# Patient Record
Sex: Male | Born: 1990 | Race: Black or African American | Hispanic: No | Marital: Single | Smoking: Former smoker
Health system: Southern US, Community
[De-identification: ages and names within clinical notes are randomized; demographics above are authoritative.]

## PROBLEM LIST (undated history)

## (undated) DIAGNOSIS — A6000 Herpesviral infection of urogenital system, unspecified: Secondary | ICD-10-CM

## (undated) HISTORY — DX: Herpesviral infection of urogenital system, unspecified: A60.00

---

## 2013-10-02 ENCOUNTER — Ambulatory Visit (INDEPENDENT_AMBULATORY_CARE_PROVIDER_SITE_OTHER): Payer: Managed Care, Other (non HMO) | Admitting: Family Medicine

## 2013-10-02 VITALS — BP 102/62 | HR 72 | Temp 98.7°F | Resp 16 | Ht 67.0 in | Wt 146.0 lb

## 2013-10-02 DIAGNOSIS — Z7251 High risk heterosexual behavior: Secondary | ICD-10-CM

## 2013-10-02 DIAGNOSIS — Z0000 Encounter for general adult medical examination without abnormal findings: Secondary | ICD-10-CM

## 2013-10-02 NOTE — Progress Notes (Addendum)
Subjective:   This chart was scribed for Dustin Staggers, MD by Arlan Organ, [facility] Scribe. This patient was seen in room 1 and the patient's care was started 2:36 PM.    Patient ID: Dustin Bolton, male    DOB: 1990/11/30, 23 y.o.   MRN: [id number]  HPI  HPI Comments: Dustin Bolton is a 23 y.o. 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 Pt states he is healthy without any medical issues. Currently he is not on any medications. States he pulled his back last year which causes him mild intermittent back pain. No history of surgeries. 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 Pt unaware of last Tetanus shot. Currently he is sexually active and has had about 15 partners. Denies any STI testing in the past. Denies any genital abnormalities. No swelling to groin. No history of TB.   There are no active problems to display for this patient.  History reviewed. No pertinent past medical history. History reviewed. No pertinent past surgical history. No Known Allergies Prior to Admission medications   Not on File   History   Social History  . Marital Status: Single    Spouse Name: N/A    Number of Children: N/A  . Years of Education: N/A   Occupational History  . Not on file.   Social History Main Topics  . Smoking status: Former Smoker    Quit date: 10/03/2011  . Smokeless tobacco: Not on file  . Alcohol Use: No  . Drug Use: No  . Sexual Activity: Not on file   Other Topics Concern  . Not on file   Social History Narrative  . No narrative on file     Review of Systems  Musculoskeletal: Positive for back pain (intermittent).  All other systems reviewed and are negative.  13 point ROS on pt health form. Positive for back pain only.  Objective:   Physical  Exam  Nursing note and vitals reviewed. Constitutional: He is oriented to person, place, and time. He appears well-developed and well-nourished.  HENT:  Head: Normocephalic and atraumatic.  Right Ear: External ear normal.  Left Ear: External ear normal.  Mouth/Throat: Oropharynx is clear and moist.  Eyes: Conjunctivae and EOM are normal. Pupils are equal, round, and reactive to light.  Neck: Normal range of motion. Neck supple. No thyromegaly present.  Cardiovascular: Normal rate, regular rhythm, normal heart sounds and intact distal pulses.   Pulmonary/Chest: Effort normal and breath sounds normal. No respiratory distress. He has no wheezes.  Abdominal: Soft. He exhibits no distension. There is no tenderness.  Musculoskeletal: Normal range of motion. He exhibits no edema and no tenderness.  Lymphadenopathy:    He has no cervical adenopathy.  Neurological: He is alert and oriented to person, place, and time. He has normal reflexes.  Skin: Skin is warm and dry.  Psychiatric: He has a normal mood and affect. His behavior is normal.       Filed Vitals:   10/02/13 1408  BP: 102/62  Pulse: 72  Temp: 98.7 F (37.1 C)  TempSrc: Oral  Resp: 16  Height: 5\' 7"  (1.702 m)  Weight: 146 lb (66.225 kg)  SpO2: 100%     Visual Acuity Screening   Right eye Left eye Both eyes  Without correction:     With correction: 20/15-2 20/15 20/13    Assessment & Plan:  .[name] is a 23 y.o. male Routine general medical examination at a health care facility - Plan: HIV antibody, RPR, GC/Chlamydia Probe Amp  Problems related to high-risk sexual behavior - Plan: HIV antibody, RPR, GC/Chlamydia Probe Amp  Age appropriate physical, with anticipatory guidance provided.  Discussed Menveo, Gardasil, and to check into last tdap.   STI testing as above. declined Hepatitis testing (thinks had Hep B vaccine series), and HSV testing.   No orders of the defined types were placed in this encounter.     Patient Instructions  You should receive a call or letter about your lab results within the next week to 10 days.   Check into your last tetanus - if more than 10 years - need repeat dose. Also - see information below on meningitis vaccine and HPV vaccine - we can perform those here if you would like.   Return to the clinic or go to the nearest emergency room if any of your symptoms worsen or new symptoms occur.  Keeping you healthy  Get these tests  Blood pressure- Have your blood pressure checked once a year by your healthcare provider.  Normal blood pressure is 120/80.  Weight- Have your body mass index (BMI) calculated to screen for obesity.  BMI is a measure of body fat based on height and weight. You can also calculate your own BMI at [email or web address].west-esparza.com/.  Cholesterol- Have your cholesterol checked regularly starting at age 61, sooner may be necessary if you have diabetes, high blood pressure, if a family member developed heart diseases at an early age or if you smoke.   Chlamydia, HIV, and other sexual transmitted disease- Get screened each year until the age of 3 then within three months of each new sexual partner.  Diabetes- Have your blood sugar checked regularly if you have high blood pressure, high cholesterol, a family history of diabetes or if you are overweight.  Get these vaccines  Flu shot- Every fall.  Tetanus shot- Every 10 years.  Menactra- Single dose; prevents meningitis.  Take these steps  Don't smoke- If you do smoke, ask your healthcare provider about quitting. For tips on how to quit, go to www.smokefree.gov or call 1-800-QUIT-NOW.  Be physically active- Exercise 5 days a week for at least 30 minutes.  If you are not already physically active start slow and gradually work up to 30 minutes of moderate physical activity.  Examples of moderate activity include walking briskly, mowing the yard, dancing, swimming bicycling, etc.  Eat a healthy diet-  Eat a variety of healthy foods such as fruits, vegetables, low fat milk, low fat cheese, yogurt, lean meats, poultry, fish, beans, tofu, etc.  For more information on healthy eating, go to www.thenutritionsource.org  Drink alcohol in moderation- Limit alcohol intake two drinks or less a day.  Never drink and drive.  Dentist- Brush and floss teeth twice daily; visit your dentis twice a year.  Depression-Your emotional health is as important as your physical health.  If you're feeling down, losing interest in things you normally enjoy please talk with your healthcare provider.  Gun Safety- If you keep a gun in your home, keep it unloaded and with the safety lock on.  Bullets should be stored separately.  Helmet use- Always wear a helmet when riding a motorcycle, bicycle, rollerblading or skateboarding.  Safe sex- If you may be exposed to a sexually  transmitted infection, use a condom  Seat belts- Seat bels can save your life; always wear one.  Smoke/Carbon Monoxide detectors- These detectors need to be installed on the appropriate level of your home.  Replace batteries at least once a year.  Skin Cancer- When out in the sun, cover up and use sunscreen SPF 15 or higher.  Violence- If anyone is threatening or hurting you, please tell your healthcare provider.   Meningococcal Vaccine What You Need to Know WHAT IS MENINGOCOCCAL DISEASE?  Meningococcal disease is a serious bacterial illness. It is a leading cause of bacterial meningitis in children 2 through 29 years old in the [place]. Meningitis is an infection of the covering of the brain and the spinal cord.  Meningococcal disease also causes blood infections.  About 1,000 to 1,200 people get meningococcal disease each year in the [place] Even when they are treated with antibiotics, 10% to 15% of these people die. Of those who live, another 11% to 19% lose their arms or legs, have problems with their nervous systems, become deaf or  mentally retarded, or suffer seizures or strokes.  Anyone can get meningococcal disease. But it is most common in infants less than 1 year of age and people 32 to 21 years. Children with certain medical conditions, such as lack of a spleen, have an increased risk of getting meningococcal disease. College freshmen living in dorms are also at increased risk.  Meningococcal infections can be treated with drugs such as penicillin. Still, many people who get the disease die from it, and many others are affected for life. This is why preventing the disease through use of meningococcal vaccine is important for people at highest risk. MENINGOCOCCAL VACCINE There are 2 kinds of meningococcal vaccines in the [place]  Meningococcal conjugate vaccine (MCV4) is the preferred vaccine for people 74 years of age and younger.  Meningococcal polysaccharide vaccine (MPSV4) has been available since the 1970s. It is the only meningococcal vaccine licensed for people older than 55. Both vaccines can prevent 4 types of meningococcal disease, including 2 of the 3 types most common in the [place] and a type that causes epidemics in [place]. There are other types of meningococcal disease; the vaccines do not protect against these.  WHO SHOULD GET MENINGOCOCCAL VACCINE AND WHEN? Routine Vaccination  Two doses of MCV4 are recommended for adolescents 11 through 24 years of age: the first dose at 72 or 24 years of age, with a booster dose at age 65.  Adolescents in this age group with HIV infection should get 3 doses: 2 doses 2 months apart at 37 or 12 years, plus a booster at age 6.  If the first dose (or series) is given between 82 and 66 years of age, the booster should be given between 62 and 15. If the first dose (or series) is given after the 16th birthday, a booster is not needed. Other People at Increased Risk  College freshmen living in dormitories.  Laboratory personnel who are routinely exposed to  meningococcal bacteria.  [employer] recruits.  Anyone traveling to, or living in, a part of the world where meningococcal disease is common, such as parts of [place].  Anyone who has a damaged spleen or whose spleen has been removed.  Anyone who has persistent complement component deficiency (an immune system disorder).  People who might have been exposed to meningitis during an outbreak. Children between 26 and 90 months of age, and anyone else with certain medical conditions  need 2 doses for adequate protection. Ask your doctor about the number and timing of doses, and the need for booster doses. MCV4 is the preferred vaccine for people in these groups who are 9 months through 24 years of age. MPSV4 can be used for adults older than 55. SOME PEOPLE SHOULD NOT GET MENINGOCOCCAL VACCINE OR SHOULD WAIT  Anyone who has ever had a severe (life-threatening) allergic reaction to a previous dose of MCV4 or MPSV4 vaccine should not get a dose of either vaccine.  Anyone who has a severe (life-threatening) allergy to any vaccine component should not get the vaccine. Tell your doctor if you have any severe allergies.  Anyone who is moderately or severely ill at the time the shot is scheduled should probably wait until they recover. Ask your doctor. People with a mild illness can usually get the vaccine.  Meningococcal vaccines may be given to pregnant women. MCV4 is a fairly new vaccine and has not been studied in pregnant women as much as MPSV4 has. It should be used only if clearly needed. The manufacturers of MCV4 maintain pregnancy registries for women who are vaccinated while pregnant. Except for children with sickle cell disease or without a working spleen, meningococcal vaccines may be given at the same time as other vaccines. WHAT ARE THE RISKS FROM MENINGOCOCCAL VACCINE? A vaccine, like any medicine, could possibly cause serious problems, such as severe allergic reactions. The risk of  meningococcal vaccine causing serious harm, or death, is extremely small. Brief fainting spells and related symptoms (such as jerking or seizure-like movements) can follow a vaccination. They happen most often with adolescents, and they can result in falls and injuries. Sitting or lying down for about 15 minutes after getting the shot, especially if you feel faint, can help prevent these injuries. Mild problems  As many as half the people who get meningococcal vaccines have mild side effects, such as redness or pain where the shot was given.  If these problems occur, they usually last for 1 or 2 days. They are more common after MCV4 than after MPSV4.  A small percentage of people who receive the vaccine develop a mild fever. Severe problems  Serious allergic reactions, within a few minutes to a few hours of the shot, are very rare. WHAT IF THERE IS A MODERATE OR SEVERE REACTION? What should I look for? Any unusual condition, such as a severe allergic reaction or a high fever. If a severe allergic reaction occurred, it would be within a few minutes to an hour after the shot. Signs of a serious allergic reaction can include difficulty breathing, weakness, hoarseness or wheezing, a fast heartbeat, hives, dizziness, paleness, or swelling of the throat. What should I do?  Call your doctor, or get the person to a doctor right away.  Tell the doctor what happened, the date and time it happened, and when the vaccination was given.  Ask the provider to report the reaction by filing a Vaccine Adverse Event Reporting System (VAERS) form. Or, you can file this report through the VAERS website at www.vaers.LAgents.nohhs.gov or by calling 1-705-036-3175. VAERS does not provide medical advice. THE NATIONAL VACCINE INJURY COMPENSATION PROGRAM The National Vaccine Injury Compensation Program (VICP) was created in 1986. Persons who believe they may have been injured by a vaccine can learn about the program and about  filing a claim by calling 1-351-072-0683 or visiting the VICP website at SpiritualWord.atwww.hrsa.gov/vaccinecompensation HOW CAN I LEARN MORE?  Your doctor can give you the  vaccine package insert or suggest other sources of information.  Call your local or state health department.  Contact the Centers for Disease Control and Prevention (CDC):  Call 334-230-9787 (1-800-CDC-INFO) or  Visit the CDC's website at PicCapture.uy CDC Meningococcal Vaccine (Interim) VIS (03/04/2010) Document Released: 03/05/2006 Document Revised: 07/31/2011 Document Reviewed: 08/28/2012 [facility] Patient Information 2014 [place], [place].   Human Papillomavirus (HPV) Gardasil Vaccine What You Need to Know WHAT IS HPV?  Genital human papillomavirus (HPV) is the most common sexually transmitted virus in the [place]. More than half of sexually active men and women are infected with HPV at some time in their lives.  About 20 million Americans are currently infected, and about 6 million more get infected each year. HPV is usually spread through sexual contact.  Most HPV infections do not cause any symptoms and go away on their own. But HPV can cause cervical cancer in women. Cervical cancer is the 2nd leading cause of cancer deaths among women around the world. In the [place], about 12,000 women get cervical cancer every year and about 4,000 are expected to die from it.  HPV is also associated with several less common cancers, such as vaginal and vulvar cancers in women, and anal and oropharyngeal (back of the throat, including base of tongue and tonsils) cancers in both men and women. HPV can also cause genital warts and warts in the throat.  There is no cure for HPV infection, but some of the problems it causes can be treated. HPV VACCINE: WHY GET VACCINATED?  The HPV vaccine you are getting is 1 of 2 vaccines that can be given to prevent HPV. It may be given to both males and females.  This vaccine can  prevent most cases of cervical cancer in females, if it is given before exposure to the virus. In addition, it can prevent vaginal and vulvar cancer in females, and genital warts and anal cancer in both males and females.  Protection from HPV vaccine is expected to be long-lasting. But vaccination is not a substitute for cervical cancer screening. Women should still get regular Pap tests. WHO SHOULD GET THIS HPV VACCINE AND WHEN? HPV vaccine is given as a 3-dose series.  1st Dose: Now.  2nd Dose: 1 to 2 months after Dose 1.  3rd Dose: 6 months after Dose 1. Additional (booster) doses are not recommended. Routine Vaccination This HPV vaccine is recommended for girls and boys 61 or 24 years of age. It may be given starting at age 64. Why is HPV vaccine recommended at 39 or 24 years of age?  HPV infection is easily acquired, even with only one sex partner. That is why it is important to get HPV vaccine before any sexual contact takes place. Also, response to the vaccine is better at this age than at older ages. Catch-Up Vaccination This vaccine is recommended for the following people who have not completed the 3-dose series:   Females 13 through 24 years of age.  Males 13 through 24 years of age. This vaccine may be given to men 22 through 24 years of age who have not completed the 3-dose series. It is recommended for men through age 10 who have sex with men or whose immune system is weakened because of HIV infection, other illness, or medications.  HPV vaccine may be given at the same time as other vaccines. SOME PEOPLE SHOULD NOT GET HPV VACCINE OR SHOULD WAIT  Anyone who has ever had a life-threatening allergic  reaction to any other component of HPV vaccine, or to a previous dose of HPV vaccine, should not get the vaccine. Tell your doctor if the person getting vaccinated has any severe allergies, including an allergy to yeast.  HPV vaccine is not recommended for pregnant women. However,  receiving HPV vaccine when pregnant is not a reason to consider terminating the pregnancy. Women who are breastfeeding may get the vaccine.  People who are mildly ill when a dose of HPV is planned can still be vaccinated. People with a moderate or severe illness should wait until they are better. WHAT ARE THE RISKS FROM THIS VACCINE?  This HPV vaccine has been used in the [place] and around the world for about 6 years and has been very safe.  However, any medicine could possibly cause a serious problem, such as a severe allergic reaction. The risk of any vaccine causing a serious injury, or death, is extremely small.  Life-threatening allergic reactions from vaccines are very rare. If they do occur, it would be within a few minutes to a few hours after the vaccination. Several mild to moderate problems are known to occur with HPV vaccine. These do not last long and go away on their own.  Reactions in the arm where the shot was given:  Pain (about 8 people in 10).  Redness or swelling (about 1 person in 4).  Fever:  Mild (100 F or 37.8 C) (about 1 person in 10).  Moderate (102 F or 38.9 C) (about 1 person in 67).  Other problems:  Headache (about 1 person in 3).  Fainting: Brief fainting spells and related symptoms (such as jerking movements) can happen after any medical procedure, including vaccination. Sitting or lying down for about 15 minutes after a vaccination can help prevent fainting and injuries caused by falls. Tell your doctor if the patient feels dizzy or lightheaded, or has vision changes or ringing in the ears.  Like all vaccines, HPV vaccines will continue to be monitored for unusual or severe problems. WHAT IF THERE IS A SERIOUS REACTION? What should I look for?  Any unusual condition, such as a high fever or unusual behavior. Signs of a serious allergic reaction can include difficulty breathing, hoarseness or wheezing, hives, paleness, weakness, a fast heartbeat, or  dizziness. What should I do?  Call a doctor, or get the person to a doctor right away.  Tell your doctor what happened, the date and time it happened, and when the vaccination was given.  Ask your doctor, nurse, or health department to report the reaction by filing a Vaccine Adverse Event Reporting System (VAERS) form. Or, you can file this report through the VAERS website at www.vaers.LAgents.no or by calling 1-(830)186-2113. VAERS does not provide medical advice. THE NATIONAL VACCINE INJURY COMPENSATION PROGRAM  The National Vaccine Injury Compensation Program (VICP) is a federal program that was created to compensate people who may have been injured by certain vaccines.  Persons who believe they may have been injured by a vaccine can learn about the program and about filing a claim by calling 1-(260) 387-1962 or visiting the VICP website at SpiritualWord.at HOW CAN I LEARN MORE?  Ask your doctor.  Call your local or state health department.  Contact the Centers for Disease Control and Prevention (CDC):  Call 574-447-9708 (1-800-CDC-INFO)  or  Visit CDC's website at PicCapture.uy CDC Human Papillomavirus (HPV) Gardasil (Interim) 10/06/11 Document Released: 03/05/2006 Document Revised: 02/26/2013 Document Reviewed: 08/27/2012 [facility] Patient Information 2014 [place], [place].  I personally performed the services described in this documentation, which was scribed in my presence. The recorded information has been reviewed and is accurate.

## 2013-10-02 NOTE — Patient Instructions (Addendum)
You should receive a call or letter about your lab results within the next week to 10 days.   Check into your last tetanus - if more than 10 years - need repeat dose. Also - see information below on meningitis vaccine and HPV vaccine - we can perform those here if you would like.   Return to the clinic or go to the nearest emergency room if any of your symptoms worsen or new symptoms occur.  Keeping you healthy  Get these tests  Blood pressure- Have your blood pressure checked once a year by your healthcare provider.  Normal blood pressure is 120/80.  Weight- Have your body mass index (BMI) calculated to screen for obesity.  BMI is a measure of body fat based on height and weight. You can also calculate your own BMI at [email or web address].west-esparza.[email or web address]  Cholesterol- Have your cholesterol checked regularly starting at age 10, sooner may be necessary if you have diabetes, high blood pressure, if a family member developed heart diseases at an early age or if you smoke.   Chlamydia, HIV, and other sexual transmitted disease- Get screened each year until the age of 61 then within three months of each new sexual partner.  Diabetes- Have your blood sugar checked regularly if you have high blood pressure, high cholesterol, a family history of diabetes or if you are overweight.  Get these vaccines  Flu shot- Every fall.  Tetanus shot- Every 10 years.  Menactra- Single dose; prevents meningitis.  Take these steps  Don't smoke- If you do smoke, ask your healthcare provider about quitting. For tips on how to quit, go to www.smokefree.gov or call 1-800-QUIT-NOW.  Be physically active- Exercise 5 days a week for at least 30 minutes.  If you are not already physically active start slow and gradually work up to 30 minutes of moderate physical activity.  Examples of moderate activity include walking briskly, mowing the yard, dancing, swimming bicycling, etc.  Eat a healthy diet- Eat a variety of healthy  foods such as fruits, vegetables, low fat milk, low fat cheese, yogurt, lean meats, poultry, fish, beans, tofu, etc.  For more information on healthy eating, go to www.thenutritionsource.org  Drink alcohol in moderation- Limit alcohol intake two drinks or less a day.  Never drink and drive.  Dentist- Brush and floss teeth twice daily; visit your dentis twice a year.  Depression-Your emotional health is as important as your physical health.  If you're feeling down, losing interest in things you normally enjoy please talk with your healthcare provider.  Gun Safety- If you keep a gun in your home, keep it unloaded and with the safety lock on.  Bullets should be stored separately.  Helmet use- Always wear a helmet when riding a motorcycle, bicycle, rollerblading or skateboarding.  Safe sex- If you may be exposed to a sexually transmitted infection, use a condom  Seat belts- Seat bels can save your life; always wear one.  Smoke/Carbon Monoxide detectors- These detectors need to be installed on the appropriate level of your home.  Replace batteries at least once a year.  Skin Cancer- When out in the sun, cover up and use sunscreen SPF 15 or higher.  Violence- If anyone is threatening or hurting you, please tell your healthcare provider.   Meningococcal Vaccine What You Need to Know WHAT IS MENINGOCOCCAL DISEASE?  Meningococcal disease is a serious bacterial illness. It is a leading cause of bacterial meningitis in children 2 through 55 years old in the [place].  Meningitis is an infection of the covering of the brain and the spinal cord.  Meningococcal disease also causes blood infections.  About 1,000 to 1,200 people get meningococcal disease each year in the [place] Even when they are treated with antibiotics, 10% to 15% of these people die. Of those who live, another 11% to 19% lose their arms or legs, have problems with their nervous systems, become deaf or mentally retarded, or suffer  seizures or strokes.  Anyone can get meningococcal disease. But it is most common in infants less than 1 year of age and people 9516 to 21 years. Children with certain medical conditions, such as lack of a spleen, have an increased risk of getting meningococcal disease. College freshmen living in dorms are also at increased risk.  Meningococcal infections can be treated with drugs such as penicillin. Still, many people who get the disease die from it, and many others are affected for life. This is why preventing the disease through use of meningococcal vaccine is important for people at highest risk. MENINGOCOCCAL VACCINE There are 2 kinds of meningococcal vaccines in the [place]  Meningococcal conjugate vaccine (MCV4) is the preferred vaccine for people 24 years of age and younger.  Meningococcal polysaccharide vaccine (MPSV4) has been available since the 1970s. It is the only meningococcal vaccine licensed for people older than 55. Both vaccines can prevent 4 types of meningococcal disease, including 2 of the 3 types most common in the [place] and a type that causes epidemics in [place]. There are other types of meningococcal disease; the vaccines do not protect against these.  WHO SHOULD GET MENINGOCOCCAL VACCINE AND WHEN? Routine Vaccination  Two doses of MCV4 are recommended for adolescents 11 through 24 years of age: the first dose at 7911 or 24 years of age, with a booster dose at age 24.  Adolescents in this age group with HIV infection should get 3 doses: 2 doses 2 months apart at 4011 or 12 years, plus a booster at age 24.  If the first dose (or series) is given between 3313 and 215 years of age, the booster should be given between 6916 and 7018. If the first dose (or series) is given after the 16th birthday, a booster is not needed. Other People at Increased Risk  College freshmen living in dormitories.  Laboratory personnel who are routinely exposed to meningococcal bacteria.  [place]  [employer] and [employer].  Anyone traveling to, or living in, a part of the world where meningococcal disease is common, such as parts of [place].  Anyone who has a damaged spleen or whose spleen has been removed.  Anyone who has persistent complement component deficiency (an immune system disorder).  People who might have been exposed to meningitis during an outbreak. Children between 619 and 5623 months of age, and anyone else with certain medical conditions need 2 doses for adequate protection. Ask your doctor about the number and timing of doses, and the need for booster doses. MCV4 is the preferred vaccine for people in these groups who are 9 months through 24 years of age. MPSV4 can be used for adults older than 55. SOME PEOPLE SHOULD NOT GET MENINGOCOCCAL VACCINE OR SHOULD WAIT  Anyone who has ever had a severe (life-threatening) allergic reaction to a previous dose of MCV4 or MPSV4 vaccine should not get a dose of either vaccine.  Anyone who has a severe (life-threatening) allergy to any vaccine component should not get the vaccine. Tell your doctor if you have any severe  allergies.  Anyone who is moderately or severely ill at the time the shot is scheduled should probably wait until they recover. Ask your doctor. People with a mild illness can usually get the vaccine.  Meningococcal vaccines may be given to pregnant women. MCV4 is a fairly new vaccine and has not been studied in pregnant women as much as MPSV4 has. It should be used only if clearly needed. The manufacturers of MCV4 maintain pregnancy registries for women who are vaccinated while pregnant. Except for children with sickle cell disease or without a working spleen, meningococcal vaccines may be given at the same time as other vaccines. WHAT ARE THE RISKS FROM MENINGOCOCCAL VACCINE? A vaccine, like any medicine, could possibly cause serious problems, such as severe allergic reactions. The risk of meningococcal vaccine causing serious  harm, or death, is extremely small. Brief fainting spells and related symptoms (such as jerking or seizure-like movements) can follow a vaccination. They happen most often with adolescents, and they can result in falls and injuries. Sitting or lying down for about 15 minutes after getting the shot, especially if you feel faint, can help prevent these injuries. Mild problems  As many as half the people who get meningococcal vaccines have mild side effects, such as redness or pain where the shot was given.  If these problems occur, they usually last for 1 or 2 days. They are more common after MCV4 than after MPSV4.  A small percentage of people who receive the vaccine develop a mild fever. Severe problems  Serious allergic reactions, within a few minutes to a few hours of the shot, are very rare. WHAT IF THERE IS A MODERATE OR SEVERE REACTION? What should I look for? Any unusual condition, such as a severe allergic reaction or a high fever. If a severe allergic reaction occurred, it would be within a few minutes to an hour after the shot. Signs of a serious allergic reaction can include difficulty breathing, weakness, hoarseness or wheezing, a fast heartbeat, hives, dizziness, paleness, or swelling of the throat. What should I do?  Call your doctor, or get the person to a doctor right away.  Tell the doctor what happened, the date and time it happened, and when the vaccination was given.  Ask the provider to report the reaction by filing a Vaccine Adverse Event Reporting System (VAERS) form. Or, you can file this report through the VAERS website at www.vaers.LAgents.no or by calling 1-(442)723-2381. VAERS does not provide medical advice. THE NATIONAL VACCINE INJURY COMPENSATION PROGRAM The National Vaccine Injury Compensation Program (VICP) was created in 1986. Persons who believe they may have been injured by a vaccine can learn about the program and about filing a claim by calling 1-(506)130-9844  or visiting the VICP website at SpiritualWord.at HOW CAN I LEARN MORE?  Your doctor can give you the vaccine package insert or suggest other sources of information.  Call your local or state health department.  Contact the Centers for Disease Control and Prevention (CDC):  Call 563-689-4110 (1-800-CDC-INFO) or  Visit the CDC's website at PicCapture.uy CDC Meningococcal Vaccine (Interim) VIS (03/04/2010) Document Released: 03/05/2006 Document Revised: 07/31/2011 Document Reviewed: 08/28/2012 [facility] Patient Information 2014 [place], [place].   Human Papillomavirus (HPV) Gardasil Vaccine What You Need to Know WHAT IS HPV?  Genital human papillomavirus (HPV) is the most common sexually transmitted virus in the [place]. More than half of sexually active men and women are infected with HPV at some time in their lives.  About 20  million Americans are currently infected, and about 6 million more get infected each year. HPV is usually spread through sexual contact.  Most HPV infections do not cause any symptoms and go away on their own. But HPV can cause cervical cancer in women. Cervical cancer is the 2nd leading cause of cancer deaths among women around the world. In the [place], about 12,000 women get cervical cancer every year and about 4,000 are expected to die from it.  HPV is also associated with several less common cancers, such as vaginal and vulvar cancers in women, and anal and oropharyngeal (back of the throat, including base of tongue and tonsils) cancers in both men and women. HPV can also cause genital warts and warts in the throat.  There is no cure for HPV infection, but some of the problems it causes can be treated. HPV VACCINE: WHY GET VACCINATED?  The HPV vaccine you are getting is 1 of 2 vaccines that can be given to prevent HPV. It may be given to both males and females.  This vaccine can prevent most cases of cervical cancer in  females, if it is given before exposure to the virus. In addition, it can prevent vaginal and vulvar cancer in females, and genital warts and anal cancer in both males and females.  Protection from HPV vaccine is expected to be long-lasting. But vaccination is not a substitute for cervical cancer screening. Women should still get regular Pap tests. WHO SHOULD GET THIS HPV VACCINE AND WHEN? HPV vaccine is given as a 3-dose series.  1st Dose: Now.  2nd Dose: 1 to 2 months after Dose 1.  3rd Dose: 6 months after Dose 1. Additional (booster) doses are not recommended. Routine Vaccination This HPV vaccine is recommended for girls and boys 3411 or 24 years of age. It may be given starting at age 359. Why is HPV vaccine recommended at 3411 or 24 years of age?  HPV infection is easily acquired, even with only one sex partner. That is why it is important to get HPV vaccine before any sexual contact takes place. Also, response to the vaccine is better at this age than at older ages. Catch-Up Vaccination This vaccine is recommended for the following people who have not completed the 3-dose series:   Females 13 through 24 years of age.  Males 13 through 24 years of age. This vaccine may be given to men 22 through 24 years of age who have not completed the 3-dose series. It is recommended for men through age 24 who have sex with men or whose immune system is weakened because of HIV infection, other illness, or medications.  HPV vaccine may be given at the same time as other vaccines. SOME PEOPLE SHOULD NOT GET HPV VACCINE OR SHOULD WAIT  Anyone who has ever had a life-threatening allergic reaction to any other component of HPV vaccine, or to a previous dose of HPV vaccine, should not get the vaccine. Tell your doctor if the person getting vaccinated has any severe allergies, including an allergy to yeast.  HPV vaccine is not recommended for pregnant women. However, receiving HPV vaccine when pregnant is  not a reason to consider terminating the pregnancy. Women who are breastfeeding may get the vaccine.  People who are mildly ill when a dose of HPV is planned can still be vaccinated. People with a moderate or severe illness should wait until they are better. WHAT ARE THE RISKS FROM THIS VACCINE?  This HPV vaccine has  been used in the [place] and around the world for about 6 years and has been very safe.  However, any medicine could possibly cause a serious problem, such as a severe allergic reaction. The risk of any vaccine causing a serious injury, or death, is extremely small.  Life-threatening allergic reactions from vaccines are very rare. If they do occur, it would be within a few minutes to a few hours after the vaccination. Several mild to moderate problems are known to occur with HPV vaccine. These do not last long and go away on their own.  Reactions in the arm where the shot was given:  Pain (about 8 people in 10).  Redness or swelling (about 1 person in 4).  Fever:  Mild (100 F or 37.8 C) (about 1 person in 10).  Moderate (102 F or 38.9 C) (about 1 person in 57).  Other problems:  Headache (about 1 person in 3).  Fainting: Brief fainting spells and related symptoms (such as jerking movements) can happen after any medical procedure, including vaccination. Sitting or lying down for about 15 minutes after a vaccination can help prevent fainting and injuries caused by falls. Tell your doctor if the patient feels dizzy or lightheaded, or has vision changes or ringing in the ears.  Like all vaccines, HPV vaccines will continue to be monitored for unusual or severe problems. WHAT IF THERE IS A SERIOUS REACTION? What should I look for?  Any unusual condition, such as a high fever or unusual behavior. Signs of a serious allergic reaction can include difficulty breathing, hoarseness or wheezing, hives, paleness, weakness, a fast heartbeat, or dizziness. What should I do?  Call  a doctor, or get the person to a doctor right away.  Tell your doctor what happened, the date and time it happened, and when the vaccination was given.  Ask your doctor, nurse, or health department to report the reaction by filing a Vaccine Adverse Event Reporting System (VAERS) form. Or, you can file this report through the VAERS website at www.vaers.LAgents.no or by calling 1-(650)327-4154. VAERS does not provide medical advice. THE NATIONAL VACCINE INJURY COMPENSATION PROGRAM  The National Vaccine Injury Compensation Program (VICP) is a federal program that was created to compensate people who may have been injured by certain vaccines.  Persons who believe they may have been injured by a vaccine can learn about the program and about filing a claim by calling 1-217-345-4097 or visiting the VICP website at SpiritualWord.at HOW CAN I LEARN MORE?  Ask your doctor.  Call your local or state health department.  Contact the Centers for Disease Control and Prevention (CDC):  Call 931-203-6880 (1-800-CDC-INFO)  or  Visit CDC's website at PicCapture.uy CDC Human Papillomavirus (HPV) Gardasil (Interim) 10/06/11 Document Released: 03/05/2006 Document Revised: 02/26/2013 Document Reviewed: 08/27/2012 [facility] Patient Information 2014 [place], [place].

## 2013-10-02 NOTE — Progress Notes (Signed)

## 2013-10-03 LAB — GC/CHLAMYDIA PROBE AMP
CT PROBE, AMP APTIMA: NEGATIVE
GC PROBE AMP APTIMA: NEGATIVE

## 2013-10-03 LAB — HIV ANTIBODY (ROUTINE TESTING W REFLEX): HIV: NONREACTIVE

## 2013-10-03 LAB — RPR

## 2017-05-25 ENCOUNTER — Encounter (HOSPITAL_COMMUNITY): Payer: Self-pay

## 2017-05-25 ENCOUNTER — Other Ambulatory Visit: Payer: Self-pay

## 2017-05-25 ENCOUNTER — Emergency Department (HOSPITAL_COMMUNITY)
Admission: EM | Admit: 2017-05-25 | Discharge: 2017-05-26 | Disposition: A | Discharge status: Home / Self Care | Payer: No Typology Code available for payment source | Attending: Emergency Medicine | Admitting: Emergency Medicine

## 2017-05-25 DIAGNOSIS — Y939 Activity, unspecified: Secondary | ICD-10-CM | POA: Diagnosis not present

## 2017-05-25 DIAGNOSIS — Z87891 Personal history of nicotine dependence: Secondary | ICD-10-CM | POA: Diagnosis not present

## 2017-05-25 DIAGNOSIS — Y998 Other external cause status: Secondary | ICD-10-CM | POA: Insufficient documentation

## 2017-05-25 DIAGNOSIS — Y92410 Unspecified street and highway as the place of occurrence of the external cause: Secondary | ICD-10-CM | POA: Diagnosis not present

## 2017-05-25 DIAGNOSIS — S39012A Strain of muscle, fascia and tendon of lower back, initial encounter: Secondary | ICD-10-CM | POA: Diagnosis not present

## 2017-05-25 DIAGNOSIS — S3992XA Unspecified injury of lower back, initial encounter: Secondary | ICD-10-CM | POA: Diagnosis present
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
## 2017-05-26 MED ORDER — CYCLOBENZAPRINE HCL 10 MG PO TABS: 10.0000 mg | ORAL_TABLET | Freq: Two times a day (BID) | ORAL | 0 refills | Status: DC | PRN

## 2017-05-26 MED ORDER — NAPROXEN 500 MG PO TABS: 500.0000 mg | ORAL_TABLET | Freq: Two times a day (BID) | ORAL | 0 refills | Status: DC

## 2017-05-26 NOTE — Discharge Instructions (Signed)
Please read attached information regarding your condition. Take naproxen and Flexeril scheduled for the next 2-3 days to help with your symptoms. Follow instructions regarding back exercises to prevent stiffness. Apply heating pad to affected area. Return to ED for worsening symptoms, severe chest pain, severe abdominal pain, additional injuries, numbness in legs or loss of your bowel or bladder function.
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
## 2018-05-27 ENCOUNTER — Encounter (HOSPITAL_COMMUNITY): Payer: Self-pay

## 2018-05-27 ENCOUNTER — Ambulatory Visit: Payer: Self-pay

## 2018-05-27 ENCOUNTER — Ambulatory Visit (HOSPITAL_COMMUNITY)
Admission: EM | Admit: 2018-05-27 | Discharge: 2018-05-27 | Disposition: A | Discharge status: Home / Self Care | Payer: Self-pay | Attending: Internal Medicine | Admitting: Internal Medicine

## 2018-05-27 ENCOUNTER — Other Ambulatory Visit: Payer: Self-pay

## 2018-05-27 DIAGNOSIS — R21 Rash and other nonspecific skin eruption: Secondary | ICD-10-CM | POA: Insufficient documentation

## 2018-05-27 MED ORDER — CLOTRIMAZOLE-BETAMETHASONE 1-0.05 % EX LOTN: TOPICAL_LOTION | Freq: Two times a day (BID) | CUTANEOUS | 0 refills | Status: DC

## 2018-05-27 NOTE — ED Triage Notes (Signed)
Pt cc has rash on his penis open wound pt states the wound came from having oral sex with is girlfriend. Pt states he lymph nodes are swollen.

## 2018-05-27 NOTE — Discharge Instructions (Addendum)
I am giving you some cream to place on the area Can use this twice a day Swab sent for testing.  We will call you with any positive results and treat accordingly Refrain from sexual activity until we get results
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
## 2018-05-29 ENCOUNTER — Telehealth (HOSPITAL_COMMUNITY): Payer: Self-pay | Admitting: Emergency Medicine

## 2018-05-29 LAB — HSV CULTURE AND TYPING

## 2018-05-29 MED ORDER — ACYCLOVIR 400 MG PO TABS: 400.0000 mg | ORAL_TABLET | Freq: Three times a day (TID) | ORAL | 0 refills | Status: AC

## 2018-05-29 NOTE — Telephone Encounter (Signed)
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   Call and spoke with patient, sent medicine to perferred pharmacy. All questions answered.

## 2018-06-13 ENCOUNTER — Encounter (HOSPITAL_COMMUNITY): Payer: Self-pay

## 2018-06-13 ENCOUNTER — Other Ambulatory Visit: Payer: Self-pay

## 2018-06-13 ENCOUNTER — Ambulatory Visit (HOSPITAL_COMMUNITY)
Admission: EM | Admit: 2018-06-13 | Discharge: 2018-06-13 | Disposition: A | Discharge status: Home / Self Care | Payer: Self-pay | Attending: Family Medicine | Admitting: Family Medicine

## 2018-06-13 DIAGNOSIS — B009 Herpesviral infection, unspecified: Secondary | ICD-10-CM | POA: Insufficient documentation

## 2018-06-13 MED ORDER — ACYCLOVIR 400 MG PO TABS: 400.0000 mg | ORAL_TABLET | Freq: Three times a day (TID) | ORAL | 0 refills | Status: DC

## 2018-06-13 NOTE — Discharge Instructions (Addendum)
We will go ahead and try another round ov acyclovir Follow up with your PCP next week as planned.  Refrain from sexual activity.

## 2018-06-13 NOTE — ED Triage Notes (Signed)
Pt cc he was told he has Herpes and he still has swelling in his groin area. X 3 weeks.

## 2018-06-16 NOTE — ED Provider Notes (Signed)
[facility]    CSN: [id number] Arrival date & time: 06/13/18  0848     History   Chief Complaint Chief Complaint  Patient presents with  . follow up visit for STD    HPI Dustin Bolton is a 28 y.o. male.   Patient is a 28 year old male the presents with follow-up for a rash.  He was seen here on 05/27/2018 and tested for HSV. This was found to be positive. He was then sent in treatment with acyclovir. He reports the rash resolved but he is still having some lymphadenopathy.  He denies any new sexual contacts since the treatment.  He denies any fever, chills, myalgias.  He denies any penile pain, swelling, testicle pain, penile discharge, dysuria.    ROS per HPI      History reviewed. No pertinent past medical history.  There are no active problems to display for this patient.   History reviewed. No pertinent surgical history.     Home Medications    Prior to Admission medications   Medication Sig Start Date End Date Taking? Authorizing Provider  acyclovir (ZOVIRAX) 400 MG tablet Take 1 tablet (400 mg total) by mouth 3 (three) times daily for 7 days. 06/13/18 06/20/18  Dahlia Byes A, NP  clotrimazole-betamethasone (LOTRISONE) lotion Apply topically 2 (two) times daily. 05/27/18   Dahlia Byes A, NP  cyclobenzaprine (FLEXERIL) 10 MG tablet Take 1 tablet (10 mg total) by mouth 2 (two) times daily as needed for muscle spasms. 05/26/17   Khatri, Hina, PA-C  naproxen (NAPROSYN) 500 MG tablet Take 1 tablet (500 mg total) by mouth 2 (two) times daily. 05/26/17   Dietrich Pates, PA-C    Family History Family History  Problem Relation Age of Onset  . Diabetes Father   . Cancer Maternal Grandmother   . Heart disease Maternal Grandmother   . Heart disease Maternal Grandfather   . Mental illness Paternal Grandmother   . Cancer Paternal Grandfather     Social History Social History   Tobacco Use  . Smoking status: Former Smoker    Last attempt to quit: 10/03/2011   Years since quitting: 6.7  . Smokeless tobacco: Never Used  Substance Use Topics  . Alcohol use: Yes  . Drug use: No     Allergies   Patient has no known allergies.   Review of Systems Review of Systems   Physical Exam Triage Vital Signs ED Triage Vitals  Enc Vitals Group     BP 06/13/18 0906 115/75     Pulse Rate 06/13/18 0906 75     Resp 06/13/18 0906 16     Temp 06/13/18 0906 98.7 F (37.1 C)     Temp src --      SpO2 06/13/18 0906 100 %     Weight --      Height --      Head Circumference --      Peak Flow --      Pain Score 06/13/18 0902 3     Pain Loc --      Pain Edu? --      Excl. in GC? --    No data found.  Updated Vital Signs BP 115/75 (BP Location: Right Arm)   Pulse 75   Temp 98.7 F (37.1 C)   Resp 16   SpO2 100%   Visual Acuity Right Eye Distance:   Left Eye Distance:   Bilateral Distance:    Right Eye Near:   Left Eye  Near:    Bilateral Near:     Physical Exam Vitals signs and nursing note reviewed.  Constitutional:      Appearance: He is well-developed.  HENT:     Head: Normocephalic and atraumatic.  Eyes:     Conjunctiva/sclera: Conjunctivae normal.  Neck:     Musculoskeletal: Neck supple.  Cardiovascular:     Rate and Rhythm: Normal rate and regular rhythm.     Heart sounds: No murmur.  Pulmonary:     Effort: Pulmonary effort is normal. No respiratory distress.     Breath sounds: Normal breath sounds.  Abdominal:     Palpations: Abdomen is soft.     Tenderness: There is no abdominal tenderness.  Skin:    General: Skin is warm and dry.  Neurological:     Mental Status: He is alert.      UC Treatments / Results  Labs (all labs ordered are listed, but only abnormal results are displayed) Labs Reviewed - No data to display  EKG None  Radiology No results found.  Procedures Procedures (including critical care time)  Medications Ordered in UC Medications - No data to display  Initial Impression /  Assessment and Plan / UC Course  I have reviewed the triage vital signs and the nursing notes.  Pertinent labs & imaging results that were available during my care of the patient were reviewed by me and considered in my medical decision making (see chart for details).     Will go ahead and treat pt with one more week of acyclovir to see if this helps. It is appropriate to extend the treatment one week if pt is still having symptoms. I would like to have pt tested for STDs again. He would like to wait until he sees his PCP next week for cost reasons. I feel this is appropriate.  Instructed to refrain from sexual activity.  Final Clinical Impressions(s) / UC Diagnoses   Final diagnoses:  HSV infection     Discharge Instructions     We will go ahead and try another round ov acyclovir Follow up with your PCP next week as planned.  Refrain from sexual activity.     ED Prescriptions    Medication Sig Dispense Auth. Provider   acyclovir (ZOVIRAX) 400 MG tablet Take 1 tablet (400 mg total) by mouth 3 (three) times daily for 7 days. 21 tablet Janace ArisBast, Gwyn Mehring A, NP     Controlled Substance Prescriptions [place] Controlled Substance Registry consulted? no   Janace ArisBast, Ada Holness A, NP 06/16/18 [phone]

## 2018-06-18 ENCOUNTER — Encounter: Payer: Self-pay | Admitting: Family Medicine

## 2018-06-18 ENCOUNTER — Ambulatory Visit: Payer: Self-pay | Admitting: Family Medicine

## 2018-06-18 DIAGNOSIS — A6000 Herpesviral infection of urogenital system, unspecified: Secondary | ICD-10-CM

## 2018-06-18 MED ORDER — VALACYCLOVIR HCL 1 G PO TABS: 1000.0000 mg | ORAL_TABLET | Freq: Every day | ORAL | 11 refills | Status: AC

## 2018-06-18 MED ORDER — VALACYCLOVIR HCL 1 G PO TABS: 1000.0000 mg | ORAL_TABLET | Freq: Every day | ORAL | 11 refills | Status: DC

## 2018-06-18 NOTE — Progress Notes (Signed)
Chief Complaint:  Dustin Bolton is a 28 y.o. male who presents today with a chief complaint of genital herpes and to establish care.   Assessment/Plan:  Genital herpes We will start suppressive therapy with Valtrex 1000 mg daily.  Recently had other STD testing done-does not need to be repeated today.  Discussed safe sex practices.  Discussed risk of transmission.  Discussed reasons to return to care.  Preventative Healthcare Patient was instructed to return soon for CPE.    Subjective:  HPI:  Genital Herpes, new problem to provider Patient started having symptoms about a year ago.  He was evaluated at several different clinics for recurrent rash in his groin area however reportedly always had negative STD testing.  Most recently he had recurrence of his rash about 2 weeks ago and was evaluated at an urgent care.  At that time, he had a swab of the lesion performed which was positive for HSV-2.  He was then started on acyclovir 400 mg 3 times daily with some improvement in his symptoms.  He recently finished his course of acyclovir and has noticed worsening rash.  Denies any other symptoms.  No discharge.  No dysuria.  No fevers or chills.  Rash is sometimes painful with a limit of the tingling sensation.  No other treatments tried.  No other obvious alleviating or aggravating factors.  ROS: Per HPI, otherwise a complete review of systems was negative.   PMH:  The following were reviewed and entered/updated in epic: Past Medical History:  Diagnosis Date  . Genital herpes    Patient Active Problem List   Diagnosis Date Noted  . Genital herpes 06/18/2018   History reviewed. No pertinent surgical history.  Family History  Problem Relation Age of Onset  . Hypertension Mother   . Diabetes Father   . Hypertension Father   . Cancer Maternal Grandmother   . Heart disease Maternal Grandmother   . Heart disease Maternal Grandfather   . Mental illness Paternal Grandmother   . Lung  cancer Paternal Grandfather        smoker  . Prostate cancer Neg Hx   . Colon cancer Neg Hx     Medications- reviewed and updated Current Outpatient Medications  Medication Sig Dispense Refill  . BLACK CURRANT SEED OIL PO Take by mouth.    . Multiple Vitamin (MULTIVITAMIN) capsule Take 1 capsule by mouth daily.    . valACYclovir (VALTREX) 1000 MG tablet Take 1 tablet (1,000 mg total) by mouth daily. 30 tablet 11   No current facility-administered medications for this visit.     Allergies-reviewed and updated No Known Allergies  Social History   Socioeconomic History  . Marital status: Single    Spouse name: Not on file  . Number of children: Not on file  . Years of education: Not on file  . Highest education level: Not on file  Occupational History  . Not on file  Social Needs  . Financial resource strain: Not on file  . Food insecurity:    Worry: Not on file    Inability: Not on file  . Transportation needs:    Medical: Not on file    Non-medical: Not on file  Tobacco Use  . Smoking status: Former Smoker    Last attempt to quit: 10/03/2011    Years since quitting: 6.7  . Smokeless tobacco: Never Used  Substance and Sexual Activity  . Alcohol use: Yes  . Drug use: Never  . Sexual activity:  Not Currently  Lifestyle  . Physical activity:    Days per week: Not on file    Minutes per session: Not on file  . Stress: Not on file  Relationships  . Social connections:    Talks on phone: Not on file    Gets together: Not on file    Attends religious service: Not on file    Active member of club or organization: Not on file    Attends meetings of clubs or organizations: Not on file    Relationship status: Not on file  Other Topics Concern  . Not on file  Social History Narrative  . Not on file         Objective:  Physical Exam: BP 110/70 (BP Location: Left Arm, Patient Position: Sitting, Cuff Size: Normal)   Pulse 65   Temp 98.6 F (37 C) (Oral)   Ht 5'  7" (1.702 m)   Wt 151 lb 3.2 oz (68.6 kg)   SpO2 99%   BMI 23.68 kg/m   Gen: NAD, resting comfortably CV: Regular rate and rhythm with no murmurs appreciated Pulm: Normal work of breathing, clear to auscultation bilaterally with no crackles, wheezes, or rhonchi GI: Normal bowel sounds present. Soft, Nontender, Nondistended  GU: Vesicular rash noted on right suprapubic and right penile shaft area.  Right-sided inguinal lymphadenopathy noted as well. MSK: No edema, cyanosis, or clubbing noted Skin: Warm, dry Neuro: Grossly normal, moves all extremities Psych: Normal affect and thought content     Dustin Bolton M. Jimmey Ralph, MD 06/18/2018 12:12 PM

## 2018-06-18 NOTE — Assessment & Plan Note (Signed)
We will start suppressive therapy with Valtrex 1000 mg daily.  Recently had other STD testing done-does not need to be repeated today.  Discussed safe sex practices.  Discussed risk of transmission.  Discussed reasons to return to care.

## 2018-06-18 NOTE — Patient Instructions (Addendum)
It was very nice to see you today!  Please take the Valtrex 1000 mg once daily.  You can also take this as needed for outbreaks if you wish.  Come back to see me soon for your physical with blood work, or sooner as needed.  Take care, Dr Jimmey Ralph

## 2018-09-23 ENCOUNTER — Encounter: Payer: Self-pay | Admitting: Family Medicine

## 2018-11-28 ENCOUNTER — Encounter: Payer: Self-pay | Admitting: Family Medicine

## 2019-01-15 ENCOUNTER — Encounter (HOSPITAL_COMMUNITY): Payer: Self-pay

## 2019-01-15 ENCOUNTER — Ambulatory Visit (HOSPITAL_COMMUNITY)
Admission: EM | Admit: 2019-01-15 | Discharge: 2019-01-15 | Disposition: A | Discharge status: Home / Self Care | Payer: Self-pay | Attending: Internal Medicine | Admitting: Internal Medicine

## 2019-01-15 ENCOUNTER — Ambulatory Visit (INDEPENDENT_AMBULATORY_CARE_PROVIDER_SITE_OTHER): Payer: Self-pay

## 2019-01-15 ENCOUNTER — Other Ambulatory Visit: Payer: Self-pay

## 2019-01-15 DIAGNOSIS — S43401A Unspecified sprain of right shoulder joint, initial encounter: Secondary | ICD-10-CM

## 2019-01-15 DIAGNOSIS — S42124A Nondisplaced fracture of acromial process, right shoulder, initial encounter for closed fracture: Secondary | ICD-10-CM

## 2019-01-15 MED ORDER — NAPROXEN 375 MG PO TABS: 375.0000 mg | ORAL_TABLET | Freq: Two times a day (BID) | ORAL | 0 refills | Status: DC

## 2019-01-15 MED ORDER — KETOROLAC TROMETHAMINE 30 MG/ML IJ SOLN
30.0000 mg | Freq: Once | INTRAMUSCULAR | Status: AC
  Administered 2019-01-15: 17:00:00 30 mg via INTRAMUSCULAR

## 2019-01-15 MED ORDER — SILVER SULFADIAZINE 1 % EX CREA: 1.0000 "application " | TOPICAL_CREAM | Freq: Every day | CUTANEOUS | 0 refills | Status: DC

## 2019-01-15 MED ORDER — KETOROLAC TROMETHAMINE 30 MG/ML IJ SOLN
INTRAMUSCULAR | Status: AC
  Filled 2019-01-15: qty 1
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
## 2019-01-15 NOTE — ED Notes (Signed)
Patient was discharged by provider and left before sling was placed on right arm.

## 2019-01-20 ENCOUNTER — Ambulatory Visit (INDEPENDENT_AMBULATORY_CARE_PROVIDER_SITE_OTHER): Payer: Self-pay | Admitting: Family Medicine

## 2019-01-20 ENCOUNTER — Other Ambulatory Visit: Payer: Self-pay

## 2019-01-20 ENCOUNTER — Ambulatory Visit
Admission: RE | Admit: 2019-01-20 | Discharge: 2019-01-20 | Disposition: A | Discharge status: Home / Self Care | Payer: No Typology Code available for payment source | Source: Ambulatory Visit | Attending: Family Medicine | Admitting: Family Medicine

## 2019-01-20 ENCOUNTER — Encounter: Payer: Self-pay | Admitting: Family Medicine

## 2019-01-20 ENCOUNTER — Other Ambulatory Visit: Payer: Self-pay | Admitting: Family Medicine

## 2019-01-20 VITALS — BP 112/60 | Ht 66.0 in | Wt 145.0 lb

## 2019-01-20 DIAGNOSIS — M25521 Pain in right elbow: Secondary | ICD-10-CM

## 2019-01-20 DIAGNOSIS — S4991XA Unspecified injury of right shoulder and upper arm, initial encounter: Secondary | ICD-10-CM
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
## 2019-01-20 NOTE — Patient Instructions (Signed)
You have a distal acromion fracture. Ice the area 15 minutes at a time 3-4 times a day. Naproxen twice a day with food for pain and inflammation. Ok to take tylenol with this - 500mg  1-2 tabs three times a day. Wear sling for next week but come out of this twice a day at least to do arm circles, swings, table slides - 3 sets of 10.  Get x-rays of your elbow after you leave today - we'll call you with the results. Out of work for next 2 weeks but it's likely it will take 6-8 weeks to fully recover from these injuries. Follow up with me in 2 weeks for reevaluation.

## 2019-02-03 ENCOUNTER — Ambulatory Visit (INDEPENDENT_AMBULATORY_CARE_PROVIDER_SITE_OTHER): Payer: Self-pay | Admitting: Family Medicine

## 2019-02-03 ENCOUNTER — Encounter: Payer: Self-pay | Admitting: Family Medicine

## 2019-02-03 ENCOUNTER — Other Ambulatory Visit: Payer: Self-pay

## 2019-02-03 VITALS — BP 100/60 | Ht 66.0 in | Wt 150.0 lb

## 2019-02-03 DIAGNOSIS — S4991XD Unspecified injury of right shoulder and upper arm, subsequent encounter: Secondary | ICD-10-CM

## 2019-02-03 DIAGNOSIS — M25521 Pain in right elbow: Secondary | ICD-10-CM

## 2019-02-03 NOTE — Patient Instructions (Signed)
You're doing great. Start theraband strengthening exercises 3 sets of 10 once a day (can start lower than this and work up from there). Icing 15 minutes at a time 3-4 times a day as needed. Tylenol, ibuprofen if needed. Call me in a week if you're unable to return to work and would extend this an additional week. Follow up with me in 3 weeks.
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
## 2019-02-10 ENCOUNTER — Other Ambulatory Visit: Payer: Self-pay | Admitting: *Deleted

## 2019-02-10 ENCOUNTER — Encounter: Payer: Self-pay | Admitting: *Deleted

## 2019-02-26 ENCOUNTER — Encounter: Payer: Self-pay | Admitting: Family Medicine

## 2019-02-26 ENCOUNTER — Other Ambulatory Visit: Payer: Self-pay

## 2019-02-26 ENCOUNTER — Ambulatory Visit (INDEPENDENT_AMBULATORY_CARE_PROVIDER_SITE_OTHER): Payer: Self-pay | Admitting: Family Medicine

## 2019-02-26 VITALS — BP 104/62 | Ht 66.0 in | Wt 150.0 lb

## 2019-02-26 DIAGNOSIS — S4991XD Unspecified injury of right shoulder and upper arm, subsequent encounter: Secondary | ICD-10-CM
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
## 2019-02-26 NOTE — Patient Instructions (Signed)
We're concerned you have damage to the labrum (cartilage) of the shoulder in addition to your healing fracture. Next step would be an MRI - contact the number on the form about [facility] Coverage. If this isn't going on would go ahead and see if you can get medicaid. Keep [place] updated on your status asap and we will go ahead with the MRI. Continue home exercises as tolerated. Icing, ibuprofen, tylenol if needed.

## 2019-02-27 ENCOUNTER — Telehealth: Payer: Self-pay

## 2019-02-27 NOTE — Addendum Note (Signed)
Addended by: Jolinda Croak E on: 02/27/2019 10:35 AM   Modules accepted: Orders

## 2019-02-27 NOTE — Telephone Encounter (Signed)
Order placed

## 2019-02-27 NOTE — Addendum Note (Signed)
Addended by: Jolinda Croak E on: 02/27/2019 01:30 PM   Modules accepted: Orders

## 2019-03-04 ENCOUNTER — Telehealth: Payer: Self-pay | Admitting: *Deleted

## 2019-03-04 NOTE — Telephone Encounter (Signed)
Per Dr Barbaraann Barthel, we can write for the patient to be out for a month at the moment and then reevaluate him after that...  Pt ok with this plan

## 2019-03-13 ENCOUNTER — Other Ambulatory Visit: Payer: Self-pay

## 2019-03-13 ENCOUNTER — Ambulatory Visit (HOSPITAL_COMMUNITY)
Admission: RE | Admit: 2019-03-13 | Discharge: 2019-03-13 | Disposition: A | Discharge status: Home / Self Care | Payer: No Typology Code available for payment source | Source: Ambulatory Visit | Attending: Family Medicine | Admitting: Family Medicine

## 2019-03-13 DIAGNOSIS — S4991XD Unspecified injury of right shoulder and upper arm, subsequent encounter: Secondary | ICD-10-CM | POA: Diagnosis present

## 2019-03-13 DIAGNOSIS — X58XXXD Exposure to other specified factors, subsequent encounter: Secondary | ICD-10-CM | POA: Diagnosis not present

## 2019-03-13 MED ORDER — GADOBENATE DIMEGLUMINE 529 MG/ML IV SOLN
0.1000 mL | Freq: Once | INTRAVENOUS | Status: AC | PRN
  Administered 2019-03-13: 14:00:00 0.1 mL via INTRA_ARTICULAR

## 2019-03-13 MED ORDER — SODIUM CHLORIDE (PF) 0.9 % IJ SOLN
10.0000 mL | Freq: Once | INTRAMUSCULAR | Status: AC
  Administered 2019-03-13: 14:00:00 10 mL

## 2019-03-13 MED ORDER — IOHEXOL 180 MG/ML  SOLN
10.0000 mL | Freq: Once | INTRAMUSCULAR | Status: AC | PRN
  Administered 2019-03-13: 10 mL via INTRA_ARTICULAR

## 2019-03-13 MED ORDER — LIDOCAINE HCL (PF) 1 % IJ SOLN
5.0000 mL | Freq: Once | INTRAMUSCULAR | Status: AC
  Administered 2019-03-13: 5 mL via INTRADERMAL

## 2019-03-17 ENCOUNTER — Other Ambulatory Visit: Payer: Self-pay

## 2019-03-17 ENCOUNTER — Ambulatory Visit (INDEPENDENT_AMBULATORY_CARE_PROVIDER_SITE_OTHER): Payer: Self-pay | Admitting: Family Medicine

## 2019-03-17 VITALS — BP 100/60 | Ht 66.0 in | Wt 150.0 lb

## 2019-03-17 DIAGNOSIS — S4991XD Unspecified injury of right shoulder and upper arm, subsequent encounter: Secondary | ICD-10-CM

## 2019-03-17 NOTE — Patient Instructions (Signed)
Your MRI is reassuring - you do have a lot of swelling in the bones of your shoulder (acromion) and a growth plate that never fused here. I expect you to have a complete recovery though it can take 3-4 months. Start physical therapy and do home exercises on days you don't go to therapy. Use pain as your guide for activities. Aleve 2 tabs twice a day with food. Icing 15 minutes at a time 3-4 times a day. Follow up with me in 6 weeks OR send me a message with an update on your status - if you're doing well you don't need to follow back up with me and we can do this through mychart.

## 2019-03-18 ENCOUNTER — Encounter: Payer: Self-pay | Admitting: Family Medicine

## 2019-03-18 NOTE — Progress Notes (Signed)
Patient here for MRI results which were reviewed with him today.  Overall very reassuring.  Suspected fracture is os acromiale but with quite a bit of edema on either side of this.  No labral or rotator cuff tears.  He will start physical therapy and home exercise program.  Icing, aleve.  Relative rest discussed.  Plan to f/u in 6 weeks via mychart or in person.

## 2020-01-02 ENCOUNTER — Other Ambulatory Visit: Payer: Self-pay

## 2020-01-02 ENCOUNTER — Encounter (HOSPITAL_COMMUNITY): Payer: Self-pay

## 2020-01-02 ENCOUNTER — Ambulatory Visit (HOSPITAL_COMMUNITY)
Admission: EM | Admit: 2020-01-02 | Discharge: 2020-01-02 | Disposition: A | Discharge status: Home / Self Care | Payer: 59 | Attending: Family Medicine | Admitting: Family Medicine

## 2020-01-02 DIAGNOSIS — K130 Diseases of lips: Secondary | ICD-10-CM

## 2020-01-02 MED ORDER — PREDNISONE 10 MG (21) PO TBPK: ORAL_TABLET | Freq: Every day | ORAL | 0 refills | Status: DC
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
## 2020-01-07 ENCOUNTER — Ambulatory Visit (HOSPITAL_COMMUNITY)
Admission: EM | Admit: 2020-01-07 | Discharge: 2020-01-07 | Disposition: A | Discharge status: Home / Self Care | Payer: 59 | Attending: Family Medicine | Admitting: Family Medicine

## 2020-01-07 ENCOUNTER — Encounter (HOSPITAL_COMMUNITY): Payer: Self-pay | Admitting: Emergency Medicine

## 2020-01-07 ENCOUNTER — Other Ambulatory Visit: Payer: Self-pay

## 2020-01-07 DIAGNOSIS — M5442 Lumbago with sciatica, left side: Secondary | ICD-10-CM

## 2020-01-07 MED ORDER — CYCLOBENZAPRINE HCL 5 MG PO TABS: 5.0000 mg | ORAL_TABLET | Freq: Three times a day (TID) | ORAL | 0 refills | Status: DC | PRN

## 2020-01-07 MED ORDER — KETOROLAC TROMETHAMINE 60 MG/2ML IM SOLN
60.0000 mg | Freq: Once | INTRAMUSCULAR | Status: AC
  Administered 2020-01-07: 60 mg via INTRAMUSCULAR

## 2020-01-07 MED ORDER — METHYLPREDNISOLONE 4 MG PO TBPK: ORAL_TABLET | ORAL | 0 refills | Status: DC

## 2020-01-07 MED ORDER — KETOROLAC TROMETHAMINE 60 MG/2ML IM SOLN
INTRAMUSCULAR | Status: AC
  Filled 2020-01-07: qty 2

## 2020-01-07 NOTE — ED Provider Notes (Signed)
[facility]    CSN: [id number] Arrival date & time: 01/07/20  1117      History   Chief Complaint Chief Complaint  Patient presents with   Back Pain    HPI Dustin Bolton is a 29 y.o. male.   Dustin Bolton presents with complaints of back pain. Was lowering a box down as he was holding it, reached backwards, which caused back to spasm/ lock up. Left low back pain which radiates down both legs and buttocks. This happened yesterday. Has had similar issue in the past, around 2013. No numbness or tingling to legs. No loss of bladder or bowel. No saddle symptoms. Has taken ibuprofen which hasn't helped, taken last around 0530 this morning.    ROS per HPI, negative if not otherwise mentioned.      Past Medical History:  Diagnosis Date   Genital herpes     Patient Active Problem List   Diagnosis Date Noted   Genital herpes 06/18/2018    History reviewed. No pertinent surgical history.     Home Medications    Prior to Admission medications   Medication Sig Start Date End Date Taking? Authorizing Provider  Multiple Vitamin (MULTIVITAMIN) capsule Take 1 capsule by mouth daily.   Yes [provider]  predniSONE (STERAPRED UNI-PAK 21 TAB) 10 MG (21) TBPK tablet Take by mouth daily. Take as directed. 01/02/20  Yes Hagler, Arlys John, MD  BLACK CURRANT SEED OIL PO Take by mouth.    [provider]  cyclobenzaprine (FLEXERIL) 5 MG tablet Take 1 tablet (5 mg total) by mouth 3 (three) times daily as needed for muscle spasms. 01/07/20   Linus Mako B, NP  methylPREDNISolone (MEDROL DOSEPAK) 4 MG TBPK tablet Per box instructions 01/07/20   Linus Mako B, NP  naproxen (NAPROSYN) 375 MG tablet Take 1 tablet (375 mg total) by mouth 2 (two) times daily. 01/15/19   LampteyBritta Mccreedy, MD  silver sulfADIAZINE (SILVADENE) 1 % cream Apply 1 application topically daily. Patient not taking: Reported on 01/20/2019 01/15/19   Merrilee Jansky, MD  valACYclovir  (VALTREX) 1000 MG tablet Take 1 tablet (1,000 mg total) by mouth daily. Patient not taking: Reported on 01/20/2019 06/18/18   Ardith Dark, MD    Family History Family History  Problem Relation Age of Onset   Hypertension Mother    Diabetes Father    Hypertension Father    Cancer Maternal Grandmother    Heart disease Maternal Grandmother    Heart disease Maternal Grandfather    Mental illness Paternal Grandmother    Lung cancer Paternal Grandfather        smoker   Prostate cancer Neg Hx    Colon cancer Neg Hx     Social History Social History   Tobacco Use   Smoking status: Former Smoker    Quit date: 10/03/2011    Years since quitting: 8.2   Smokeless tobacco: Never Used  Vaping Use   Vaping Use: Never used  Substance Use Topics   Alcohol use: Yes    Comment: socially   Drug use: Never     Allergies   Patient has no known allergies.   Review of Systems Review of Systems   Physical Exam Triage Vital Signs ED Triage Vitals  Enc Vitals Group     BP 01/07/20 1303 120/81     Pulse Rate 01/07/20 1303 70     Resp 01/07/20 1303 18     Temp 01/07/20 1303 98.5 F (  36.9 C)     Temp Source 01/07/20 1303 Oral     SpO2 01/07/20 1303 100 %     Weight --      Height --      Head Circumference --      Peak Flow --      Pain Score 01/07/20 1301 10     Pain Loc --      Pain Edu? --      Excl. in GC? --    No data found.  Updated Vital Signs BP 120/81 (BP Location: Right Arm)    Pulse 70    Temp 98.5 F (36.9 C) (Oral)    Resp 18    SpO2 100%   Visual Acuity Right Eye Distance:   Left Eye Distance:   Bilateral Distance:    Right Eye Near:   Left Eye Near:    Bilateral Near:     Physical Exam Constitutional:      Appearance: He is well-developed.  Cardiovascular:     Rate and Rhythm: Normal rate.  Pulmonary:     Effort: Pulmonary effort is normal.  Musculoskeletal:     Lumbar back: Spasms and tenderness present. No bony tenderness.  Normal range of motion. Positive left straight leg raise test.     Comments: pain with transition from sit to lay and lay to sit; ambulatory without difficulty; strength equal bilaterally; gross sensation intact to lower extremities   Skin:    General: Skin is warm and dry.  Neurological:     Mental Status: He is alert and oriented to person, place, and time.      UC Treatments / Results  Labs (all labs ordered are listed, but only abnormal results are displayed) Labs Reviewed - No data to display  EKG   Radiology No results found.  Procedures Procedures (including critical care time)  Medications Ordered in UC Medications  ketorolac (TORADOL) injection 60 mg (has no administration in time range)    Initial Impression / Assessment and Plan / UC Course  I have reviewed the triage vital signs and the nursing notes.  Pertinent labs & imaging results that were available during my care of the patient were reviewed by me and considered in my medical decision making (see chart for details).     Sciatica. No red flag findings. Pain management and treatment provided and discussed. Patient verbalized understanding and agreeable to plan.  Ambulatory out of clinic without difficulty.    Final Clinical Impressions(s) / UC Diagnoses   Final diagnoses:  Acute left-sided low back pain with left-sided sciatica     Discharge Instructions     Light and regular activity as tolerated.  See exercises provided.  Heat application while active can help with muscle spasms.  Sleep with pillow under your knees.   Medrol dose pack. May use ibuprofen, up to 800mg  every 8 hours as needed, once the medrol pack is completed.  Flexeril as needed as a muscle relaxer, may be helpful before bed as it can cause drowsiness.     ED Prescriptions    Medication Sig Dispense Auth. Provider   methylPREDNISolone (MEDROL DOSEPAK) 4 MG TBPK tablet Per box instructions 21 tablet Patrcia Schnepp B, NP    cyclobenzaprine (FLEXERIL) 5 MG tablet Take 1 tablet (5 mg total) by mouth 3 (three) times daily as needed for muscle spasms. 15 tablet , NP     PDMP not reviewed this encounter.   Georgetta Haber, NP  01/07/20 1338 ° °

## 2020-01-07 NOTE — ED Triage Notes (Signed)
Pt presents with lower back pain xs 2 days. Pt reached down to pick box up while trying to hold door open and unable to stand back up without large amount of pain.

## 2020-01-07 NOTE — Discharge Instructions (Signed)
Light and regular activity as tolerated.  See exercises provided.  Heat application while active can help with muscle spasms.  Sleep with pillow under your knees.   Medrol dose pack. May use ibuprofen, up to 800mg  every 8 hours as needed, once the medrol pack is completed.  Flexeril as needed as a muscle relaxer, may be helpful before bed as it can cause drowsiness.

## 2020-01-27 ENCOUNTER — Ambulatory Visit (HOSPITAL_COMMUNITY)
Admission: EM | Admit: 2020-01-27 | Discharge: 2020-01-27 | Disposition: A | Discharge status: Home / Self Care | Payer: 59 | Attending: Family Medicine | Admitting: Family Medicine

## 2020-01-27 ENCOUNTER — Encounter (HOSPITAL_COMMUNITY): Payer: Self-pay

## 2020-01-27 ENCOUNTER — Other Ambulatory Visit: Payer: Self-pay

## 2020-01-27 DIAGNOSIS — K130 Diseases of lips: Secondary | ICD-10-CM | POA: Diagnosis not present

## 2020-01-27 MED ORDER — PREDNISONE 10 MG (21) PO TBPK: ORAL_TABLET | Freq: Every day | ORAL | 0 refills | Status: AC

## 2020-01-27 NOTE — Discharge Instructions (Signed)
I have sent in a prednisone taper for you to take for 6 days. 6 tablets on day one, 5 tablets on day two, 4 tablets on day three, 3 tablets on day four, 2 tablets on day five, and 1 tablet on day six.  Follow up with oral surgery if symptoms are persisting

## 2020-01-27 NOTE — ED Triage Notes (Signed)
Pt is here with swollen lips after his continuous biting of his lips at night while he sleeps, pt states he was having sciatic nerve pain in his lips & was seen on 01/02/2020.

## 2020-01-27 NOTE — ED Provider Notes (Signed)
[facility]    CSN: [id number] Arrival date & time: 01/27/20  6195      History   Chief Complaint Chief Complaint  Patient presents with  . lip problem    HPI Dustin Bolton is a 29 y.o. male.   Reports that he had a mucocele to the R lower lip. Reports that the area was improving with steroids and then he bit the area. States that the area is swollen and tender to touch. Reports that he bit his lip in his sleep. Has not attempted OTC treatment. Denies headache, sore throat, nausea, vomiting, diarrhea, rash, fever, other symptoms.  ROS per HPI  The history is provided by the patient.    Past Medical History:  Diagnosis Date  . Genital herpes     Patient Active Problem List   Diagnosis Date Noted  . Genital herpes 06/18/2018    History reviewed. No pertinent surgical history.     Home Medications    Prior to Admission medications   Medication Sig Start Date End Date Taking? Authorizing Provider  BLACK CURRANT SEED OIL PO Take by mouth.    [provider]  cyclobenzaprine (FLEXERIL) 5 MG tablet Take 1 tablet (5 mg total) by mouth 3 (three) times daily as needed for muscle spasms. 01/07/20   Georgetta Haber, NP  Multiple Vitamin (MULTIVITAMIN) capsule Take 1 capsule by mouth daily.    [provider]  naproxen (NAPROSYN) 375 MG tablet Take 1 tablet (375 mg total) by mouth 2 (two) times daily. 01/15/19   Merrilee Jansky, MD  predniSONE (STERAPRED UNI-PAK 21 TAB) 10 MG (21) TBPK tablet Take by mouth daily for 6 days. Take 6 tablets on day 1, 5 tablets on day 2, 4 tablets on day 3, 3 tablets on day 4, 2 tablets on day 5, 1 tablet on day 6 01/27/20 02/02/20  Moshe Cipro, NP  silver sulfADIAZINE (SILVADENE) 1 % cream Apply 1 application topically daily. Patient not taking: Reported on 01/20/2019 01/15/19   Merrilee Jansky, MD  valACYclovir (VALTREX) 1000 MG tablet Take 1 tablet (1,000 mg total) by mouth daily. Patient not taking: Reported  on 01/20/2019 06/18/18   Ardith Dark, MD    Family History Family History  Problem Relation Age of Onset  . Hypertension Mother   . Diabetes Father   . Hypertension Father   . Cancer Maternal Grandmother   . Heart disease Maternal Grandmother   . Heart disease Maternal Grandfather   . Mental illness Paternal Grandmother   . Lung cancer Paternal Grandfather        smoker  . Prostate cancer Neg Hx   . Colon cancer Neg Hx     Social History Social History   Tobacco Use  . Smoking status: Former Smoker    Quit date: 10/03/2011    Years since quitting: 8.3  . Smokeless tobacco: Never Used  Vaping Use  . Vaping Use: Never used  Substance Use Topics  . Alcohol use: Yes    Comment: socially  . Drug use: Yes    Types: Marijuana     Allergies   Patient has no known allergies.   Review of Systems Review of Systems   Physical Exam Triage Vital Signs ED Triage Vitals  Enc Vitals Group     BP 01/27/20 1212 107/74     Pulse Rate 01/27/20 1212 72     Resp 01/27/20 1212 17     Temp 01/27/20 1212 98.6 F (  37 C)     Temp Source 01/27/20 1212 Oral     SpO2 01/27/20 1212 100 %     Weight --      Height --      Head Circumference --      Peak Flow --      Pain Score 01/27/20 1210 0     Pain Loc --      Pain Edu? --      Excl. in GC? --    No data found.  Updated Vital Signs BP 107/74 (BP Location: Right Arm)   Pulse 72   Temp 98.6 F (37 C) (Oral)   Resp 17   SpO2 100%   Visual Acuity Right Eye Distance:   Left Eye Distance:   Bilateral Distance:    Right Eye Near:   Left Eye Near:    Bilateral Near:     Physical Exam Vitals and nursing note reviewed.  Constitutional:      Appearance: Normal appearance. He is well-developed.  HENT:     Head: Normocephalic and atraumatic.     Mouth/Throat:     Comments: Mucocele to R lower lip  Eyes:     Extraocular Movements: Extraocular movements intact.     Conjunctiva/sclera: Conjunctivae normal.      Pupils: Pupils are equal, round, and reactive to light.  Cardiovascular:     Rate and Rhythm: Normal rate and regular rhythm.     Heart sounds: Normal heart sounds. No murmur heard.   Pulmonary:     Effort: Pulmonary effort is normal. No respiratory distress.     Breath sounds: Normal breath sounds. No stridor. No wheezing, rhonchi or rales.  Chest:     Chest wall: No tenderness.  Abdominal:     Palpations: Abdomen is soft.     Tenderness: There is no abdominal tenderness.  Musculoskeletal:        General: Normal range of motion.     Cervical back: Neck supple.  Skin:    General: Skin is warm and dry.     Capillary Refill: Capillary refill takes less than 2 seconds.  Neurological:     General: No focal deficit present.     Mental Status: He is alert and oriented to person, place, and time.  Psychiatric:        Mood and Affect: Mood normal.        Behavior: Behavior normal.        Thought Content: Thought content normal.      UC Treatments / Results  Labs (all labs ordered are listed, but only abnormal results are displayed) Labs Reviewed - No data to display  EKG   Radiology No results found.  Procedures Procedures (including critical care time)  Medications Ordered in UC Medications - No data to display  Initial Impression / Assessment and Plan / UC Course  I have reviewed the triage vital signs and the nursing notes.  Pertinent labs & imaging results that were available during my care of the patient were reviewed by me and considered in my medical decision making (see chart for details).     Mucocele   R lower lip Bit the area in his sleep Is going to get a mouth guard today to help prevent worsening trauma Prednisone taper prescribed Follow up with oral surgery if symptoms persist Final Clinical Impressions(s) / UC Diagnoses   Final diagnoses:  Mucocele of lower lip     Discharge Instructions  I have sent in a prednisone taper for you to  take for 6 days. 6 tablets on day one, 5 tablets on day two, 4 tablets on day three, 3 tablets on day four, 2 tablets on day five, and 1 tablet on day six.  Follow up with oral surgery if symptoms are persisting     ED Prescriptions    Medication Sig Dispense Auth. Provider   predniSONE (STERAPRED UNI-PAK 21 TAB) 10 MG (21) TBPK tablet Take by mouth daily for 6 days. Take 6 tablets on day 1, 5 tablets on day 2, 4 tablets on day 3, 3 tablets on day 4, 2 tablets on day 5, 1 tablet on day 6 21 tablet Moshe Cipro, NP     PDMP not reviewed this encounter.   Moshe Cipro, NP 01/27/20 1246
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
## 2022-02-13 ENCOUNTER — Encounter: Payer: Self-pay | Admitting: *Deleted

## 2022-05-04 ENCOUNTER — Encounter: Payer: Self-pay | Admitting: *Deleted

## 2024-06-20 ENCOUNTER — Ambulatory Visit: Admitting: Medical

## 2024-06-20 ENCOUNTER — Encounter: Payer: Self-pay | Admitting: Medical

## 2024-06-20 VITALS — BP 112/70 | HR 71 | Temp 98.0°F | Ht 65.75 in | Wt 155.0 lb

## 2024-06-20 DIAGNOSIS — N489 Disorder of penis, unspecified: Secondary | ICD-10-CM | POA: Diagnosis not present

## 2024-06-20 DIAGNOSIS — M545 Low back pain, unspecified: Secondary | ICD-10-CM

## 2024-06-20 DIAGNOSIS — G8929 Other chronic pain: Secondary | ICD-10-CM | POA: Diagnosis not present

## 2024-06-20 DIAGNOSIS — K429 Umbilical hernia without obstruction or gangrene: Secondary | ICD-10-CM | POA: Diagnosis not present

## 2024-06-20 MED ORDER — VALACYCLOVIR HCL 1 G PO TABS: ORAL_TABLET | ORAL | 0 refills | Status: AC

## 2024-06-20 NOTE — Progress Notes (Signed)
 "  Name: Dustin Bolton   Date of Visit: 06/20/24   Date of last visit with me: Visit date not found   CHIEF COMPLAINT:  Chief Complaint  Patient presents with   New Patient (Initial Visit)    New pt get established, has herpes 2 dx in 2020, usually goes about 5 months without flares but this past month has had 2 flares, would like labs done- PSA, sickle Cell, any cancers, hasn't seen a doctor 2023, lower back pain for a couple years. Pain in belly button- had U/s and MRI in 2023 but was told he couldn't find anything.         HPI:  Discussed the use of AI scribe software for clinical note transcription with the patient, who gave verbal consent to proceed.  History of Present Illness  Dustin Bolton is a 34 year old male who presents with skin discoloration on the penis and abdominal pain.  He has a discoloration on the shaft of his penis that began as a mole in 2020 during a severe herpes outbreak when he was not on medication. The mole improved, but the skin discoloration has persisted since 2022. The discoloration is described as black and similar to a mole, with no change in size. He experiences occasional nerve pain when urinating, described as a 'nerve shock.' He has not had any new sexual partners in the last five years and last underwent STD screening in 2023.  He experiences sharp pains in the periumbilical area, particularly when bending over, which have worsened over time. This pain has been present since he was younger but has become more noticeable recently. An MRI and ultrasound were performed previously, but no abnormalities were found.  He reports lower back pain that has persisted for over two to three years, located near where his pelvis and spine meet. He describes the pain as tightness in the muscles, and denies radiation, numbness, or tingling in his legs. He has not had any x-rays of his lower back but has seen an upper back chiropractor in the past, which provided some  relief. His back pain is more pronounced after periods of inactivity, such as after dinner.  He is physically active, practicing martial arts, and works as a curator, which requires regular movement. He uses Cyclafem as needed for herpes outbreaks and reports having two to three outbreaks in the current month, after a period of dormancy from July to December of the previous year.  No other aggravating or relieving factors. No other complaint.  Past Medical History:  Diagnosis Date   Genital herpes     ROS as in subjective    Objective: BP 112/70   Pulse 71   Temp 98 F (36.7 C)   Ht 5' 5.75 (1.67 m)   Wt 155 lb (70.3 kg)   SpO2 99%   BMI 25.21 kg/m   General appearence: alert, no distress, WD/WN, lean African-American male Abdomen: +bs, soft, possible small umbilical hernia, otherwise non tender, non distended, no masses, no hepatomegaly, no splenomegaly Pulses: 2+ symmetric, upper and lower extremities, normal cap refill Back: mild lumbar paraspinal tenderness, but normal ROM, no other deformity Legs unremarkable, normal ROM Legs with normal strength, sensation and DTRs GU: Circumcised penis, testicles nontender with no mass, no lymphadenopathy, no hernia.  There is a dark coloration at the base of the penis dorsally approximately 3 cm squarish area uniform color that could be scarring versus other.    Assessment: Encounter Diagnoses  Name Primary?  Chronic bilateral low back pain without sciatica Yes   Penile abnormality    Umbilical hernia without obstruction and without gangrene      Plan:  Genital herpes with penile skin changes Chronic penile skin discoloration since 2022, possibly related to previous herpes outbreak. Differential includes scarring from herpes or shingles or other. - Referred to urology for evaluation of penile skin changes. - Prescribed Valtrex  90 tablets for flare-ups, instructed to take two pills twice a day for two days during  outbreaks. - Will consider referral to plastic surgery if urology deems it necessary for consult  Chronic low back pain Pain for two to three years, exacerbated by physical activity. Good range of motion and strength. Pain localized to lower back near pelvis and spine junction. - Ordered baseline x-ray of the lower back. - Will consider referral to physical therapy based on x-ray results.  Umbilical hernia with intermittent pain Intermittent umbilical pain, possibly related to small umbilical hernia. Pain occurs periodically, especially after physical activity. - Continue to monitor symptoms and consider surgical evaluation if pain becomes constant or significantly bothersome.  Return soon for fasting well visit  [name] was seen today for new patient (initial visit).  Diagnoses and all orders for this visit:  Chronic bilateral low back pain without sciatica -     DG Lumbar Spine Complete; Future  Penile abnormality -     Ambulatory referral to Urology  Umbilical hernia without obstruction and without gangrene    F/u pending xray, referral      "

## 2024-06-20 NOTE — Patient Instructions (Signed)
 Please go to [facility] for your lumbar spine xray.   Their hours are 8am - 4:30 pm Monday - Friday.  Take your insurance card with you.  [facility] [phone]   [address] [place], [place] [address]

## 2024-07-28 ENCOUNTER — Ambulatory Visit: Admitting: Urology

## 2024-07-28 ENCOUNTER — Encounter: Admitting: Medical

## 2024-07-29 ENCOUNTER — Ambulatory Visit: Admitting: Urology
